# Patient Record
Sex: Male | Born: 1960 | Race: White | Hispanic: No | Marital: Married | Smoking: Never smoker
Health system: Southern US, Community
[De-identification: ages and names within clinical notes are randomized; demographics above are authoritative.]

## PROBLEM LIST (undated history)

## (undated) DIAGNOSIS — T1491XA Suicide attempt, initial encounter: Secondary | ICD-10-CM

## (undated) DIAGNOSIS — I10 Essential (primary) hypertension: Secondary | ICD-10-CM

## (undated) DIAGNOSIS — E7800 Pure hypercholesterolemia, unspecified: Secondary | ICD-10-CM

## (undated) DIAGNOSIS — F329 Major depressive disorder, single episode, unspecified: Secondary | ICD-10-CM

## (undated) DIAGNOSIS — G8929 Other chronic pain: Secondary | ICD-10-CM

## (undated) DIAGNOSIS — F32A Depression, unspecified: Secondary | ICD-10-CM

## (undated) HISTORY — PX: APPENDECTOMY: SHX54

## (undated) HISTORY — PX: HIP SURGERY: SHX245

---

## 2008-10-09 ENCOUNTER — Encounter: Payer: Self-pay | Admitting: Physician Assistant

## 2008-10-26 ENCOUNTER — Encounter: Payer: Self-pay | Admitting: Physician Assistant

## 2008-11-25 ENCOUNTER — Encounter: Payer: Self-pay | Admitting: Physician Assistant

## 2009-09-04 ENCOUNTER — Emergency Department (HOSPITAL_COMMUNITY): Admission: EM | Admit: 2009-09-04 | Discharge: 2009-09-04 | Payer: Self-pay | Admitting: Emergency Medicine

## 2011-02-24 ENCOUNTER — Other Ambulatory Visit (HOSPITAL_COMMUNITY): Payer: Self-pay | Admitting: Family Medicine

## 2011-02-24 ENCOUNTER — Ambulatory Visit (HOSPITAL_COMMUNITY)
Admission: RE | Admit: 2011-02-24 | Discharge: 2011-02-24 | Disposition: A | Discharge status: Home / Self Care | Payer: Medicaid Other | Source: Ambulatory Visit | Attending: Family Medicine | Admitting: Family Medicine

## 2011-02-24 DIAGNOSIS — Z96649 Presence of unspecified artificial hip joint: Secondary | ICD-10-CM

## 2011-02-24 DIAGNOSIS — M25559 Pain in unspecified hip: Secondary | ICD-10-CM | POA: Insufficient documentation

## 2011-04-07 ENCOUNTER — Inpatient Hospital Stay (HOSPITAL_COMMUNITY)
Admission: AD | Admit: 2011-04-07 | Discharge: 2011-04-10 | DRG: 885 | Disposition: A | Discharge status: Home / Self Care | Payer: Medicaid Other | Source: Ambulatory Visit | Attending: Psychiatry | Admitting: Psychiatry

## 2011-04-07 ENCOUNTER — Encounter: Payer: Self-pay | Admitting: *Deleted

## 2011-04-07 ENCOUNTER — Emergency Department (HOSPITAL_COMMUNITY)
Admission: EM | Admit: 2011-04-07 | Discharge: 2011-04-07 | Disposition: A | Discharge status: Other Acute Inpatient Hospital | Payer: Medicaid Other | Attending: Emergency Medicine | Admitting: Emergency Medicine

## 2011-04-07 DIAGNOSIS — F3289 Other specified depressive episodes: Secondary | ICD-10-CM | POA: Insufficient documentation

## 2011-04-07 DIAGNOSIS — I10 Essential (primary) hypertension: Secondary | ICD-10-CM

## 2011-04-07 DIAGNOSIS — R45851 Suicidal ideations: Secondary | ICD-10-CM

## 2011-04-07 DIAGNOSIS — F32A Depression, unspecified: Secondary | ICD-10-CM

## 2011-04-07 DIAGNOSIS — M25559 Pain in unspecified hip: Secondary | ICD-10-CM

## 2011-04-07 DIAGNOSIS — Z560 Unemployment, unspecified: Secondary | ICD-10-CM

## 2011-04-07 DIAGNOSIS — F339 Major depressive disorder, recurrent, unspecified: Principal | ICD-10-CM

## 2011-04-07 DIAGNOSIS — M549 Dorsalgia, unspecified: Secondary | ICD-10-CM

## 2011-04-07 DIAGNOSIS — F411 Generalized anxiety disorder: Secondary | ICD-10-CM

## 2011-04-07 DIAGNOSIS — E7800 Pure hypercholesterolemia, unspecified: Secondary | ICD-10-CM | POA: Insufficient documentation

## 2011-04-07 DIAGNOSIS — E785 Hyperlipidemia, unspecified: Secondary | ICD-10-CM

## 2011-04-07 DIAGNOSIS — G8929 Other chronic pain: Secondary | ICD-10-CM

## 2011-04-07 DIAGNOSIS — F329 Major depressive disorder, single episode, unspecified: Secondary | ICD-10-CM | POA: Insufficient documentation

## 2011-04-07 DIAGNOSIS — Z79899 Other long term (current) drug therapy: Secondary | ICD-10-CM

## 2011-04-07 HISTORY — DX: Essential (primary) hypertension: I10

## 2011-04-07 HISTORY — DX: Major depressive disorder, single episode, unspecified: F32.9

## 2011-04-07 HISTORY — DX: Pure hypercholesterolemia, unspecified: E78.00

## 2011-04-07 HISTORY — DX: Depression, unspecified: F32.A

## 2011-04-07 HISTORY — DX: Other chronic pain: G89.29

## 2011-04-07 HISTORY — DX: Suicide attempt, initial encounter: T14.91XA

## 2011-04-07 LAB — COMPREHENSIVE METABOLIC PANEL
AST: 18 U/L (ref 0–37)
Albumin: 3.9 g/dL (ref 3.5–5.2)
Calcium: 9 mg/dL (ref 8.4–10.5)
Creatinine, Ser: 1.07 mg/dL (ref 0.50–1.35)
GFR calc non Af Amer: >60 mL/min (ref >60)
Total Protein: 6.4 g/dL (ref 6.0–8.3)

## 2011-04-07 LAB — CBC
MCH: 29.9 pg (ref 26.0–34.0)
MCHC: 34.6 g/dL (ref 30.0–36.0)
MCV: 86.6 fL (ref 78.0–100.0)
Platelets: 177 10*3/uL (ref 150–400)
RDW: 12.2 % (ref 11.5–15.5)

## 2011-04-07 LAB — RAPID URINE DRUG SCREEN, HOSP PERFORMED
Barbiturates: NOT DETECTED
Benzodiazepines: NOT DETECTED
Cocaine: NOT DETECTED
Opiates: NOT DETECTED
Tetrahydrocannabinol: NOT DETECTED

## 2011-04-07 NOTE — ED Notes (Signed)
Pt has been evaluated by Tommy, [facility]. Pt remains calm and cooperative. Sitting on bedside. NAD. Spouse states she will take pts belongings home.

## 2011-04-07 NOTE — ED Provider Notes (Signed)
History     CSN: [id number] Arrival date & time: 04/07/2011 12:38 PM  Chief Complaint  Patient presents with  . Medical Clearance   HPI Pt was seen at 1330.  Per pt, c/o gradual onset and worsening of persistent depression and SI x2 weeks.  Pt states he will "cut myself up."  States his last SA was approx 2-3 mos ago by "cutting off a bunch of moles."  States he "thought I would bleed to death if I cut a bunch off."  Pt states he told his therapist at that time "what she wanted to hear so I wouldn't get admitted to the psych hospital."  Pt states he was eval by his outpt MH provider this morning, who sent him to the ED for eval and admit.  Denies HI.   Past Medical History  Diagnosis Date  . Suicide attempt   . Hypertension   . High cholesterol   . Depression   . Chronic pain     Past Surgical History  Procedure Date  . Hip surgery   . Appendectomy     History  Substance Use Topics  . Smoking status: Never Smoker   . Smokeless tobacco: Not on file  . Alcohol Use: No    Review of Systems ROS: Statement: All systems negative except as marked or noted in the HPI; Constitutional: Negative for fever and chills. ; ; Eyes: Negative for eye pain, redness and discharge. ; ; ENMT: Negative for ear pain, hoarseness, nasal congestion, sinus pressure and sore throat. ; ; Cardiovascular: Negative for chest pain, palpitations, diaphoresis, dyspnea and peripheral edema. ; ; Respiratory: Negative for cough, wheezing and stridor. ; ; Gastrointestinal: Negative for nausea, vomiting, diarrhea and abdominal pain, blood in stool, hematemesis, jaundice and rectal bleeding. . ; ; Genitourinary: Negative for dysuria, flank pain and hematuria. ; ; Musculoskeletal: Negative for back pain and neck pain. Negative for swelling and trauma.; ; Skin: Negative for pruritus, rash, abrasions, blisters, bruising and skin lesion.; ; Neuro: Negative for headache, lightheadedness and neck stiffness. Negative for weakness,  altered level of consciousness , altered mental status, extremity weakness, paresthesias, involuntary movement, seizure and syncope.  +SI, depression.    Physical Exam  BP 142/109  Pulse 58  Temp(Src) 98.5 F (36.9 C) (Oral)  Resp 16  Ht 5\' 9"  (1.753 m)  Wt 210 lb (95.255 kg)  BMI 31.01 kg/m2  SpO2 99%  Physical Exam 1335: Physical examination:  Nursing notes reviewed; Vital signs and O2 SAT reviewed;  Constitutional: Well developed, Well nourished, Well hydrated, In no acute distress, eating a meal during exam; Head:  Normocephalic, atraumatic; Eyes: EOMI, PERRL, No scleral icterus; ENMT: Mouth and pharynx normal, Mucous membranes moist; Neck: Supple, Full range of motion, No lymphadenopathy; Cardiovascular: Regular rate and rhythm, No murmur, rub, or gallop; Respiratory: Breath sounds clear & equal bilaterally, No rales, rhonchi, wheezes, or rub, Normal respiratory effort/excursion; Chest: Nontender, Movement normal;  Extremities: Pulses normal, No tenderness, No edema, No calf edema or asymmetry.; Neuro: AA&Ox3, Major CN grossly intact.  No gross focal motor or sensory deficits in extremities.; Skin: Color normal, Warm, Dry.  Affect:  Flat, +SI.    ED Course  Procedures  MDM MDM Reviewed: nursing note and vitals Interpretation: labs   Results for orders placed during the hospital encounter of 04/07/11  CBC      Component Value Range   WBC 5.8  4.0 - 10.5 (K/uL)   RBC 5.68  4.22 - 5.81 (MIL/uL)  Hemoglobin 17.0  13.0 - 17.0 (g/dL)   HCT 16.1  09.6 - 04.5 (%)   MCV 86.6  78.0 - 100.0 (fL)   MCH 29.9  26.0 - 34.0 (pg)   MCHC 34.6  30.0 - 36.0 (g/dL)   RDW 40.9  81.1 - 91.4 (%)   Platelets 177  150 - 400 (K/uL)  COMPREHENSIVE METABOLIC PANEL      Component Value Range   Sodium 138  135 - 145 (mEq/L)   Potassium 4.0  3.5 - 5.1 (mEq/L)   Chloride 102  96 - 112 (mEq/L)   CO2 30  19 - 32 (mEq/L)   Glucose, Bld 129 (*) 70 - 99 (mg/dL)   BUN 13  6 - 23 (mg/dL)   Creatinine,  Ser 7.82  0.50 - 1.35 (mg/dL)   Calcium 9.0  8.4 - 95.6 (mg/dL)   Total Protein 6.4  6.0 - 8.3 (g/dL)   Albumin 3.9  3.5 - 5.2 (g/dL)   AST 18  0 - 37 (U/L)   ALT 26  0 - 53 (U/L)   Alkaline Phosphatase 90  39 - 117 (U/L)   Total Bilirubin 0.4  0.3 - 1.2 (mg/dL)   GFR calc non Af Amer >60  >60 (mL/min)   GFR calc Af Amer >60  >60 (mL/min)  ETHANOL      Component Value Range   Alcohol, Ethyl (B) <11  0 - 11 (mg/dL)  URINE RAPID DRUG SCREEN (HOSP PERFORMED)      Component Value Range   Opiates NONE DETECTED  NONE DETECTED    Cocaine NONE DETECTED  NONE DETECTED    Benzodiazepines NONE DETECTED  NONE DETECTED    Amphetamines NONE DETECTED  NONE DETECTED    Tetrahydrocannabinol NONE DETECTED  NONE DETECTED    Barbiturates NONE DETECTED  NONE DETECTED      3:32 PM:  ACT team to eval.  10:07 PM:  Pt accepted to [facility].  Will transfer stable.    Raygan Skarda Allison Quarry, DO 04/07/11 2219

## 2011-04-07 NOTE — ED Notes (Signed)
Sitter at bedside. NAD. PT is calm and cooperative at this time. Family member at bedside.

## 2011-04-07 NOTE — ED Notes (Signed)
C/o SI x 2 wks.  States would "cut myself up."  Last attempted suicide x 3 months ago.  Denies HI.  Denies hallucinations/delusions/hearing voices.  Pt states "I know I need help and I'm to the point where I can't trust myself anymore."

## 2011-04-08 DIAGNOSIS — F339 Major depressive disorder, recurrent, unspecified: Secondary | ICD-10-CM

## 2011-04-08 DIAGNOSIS — F411 Generalized anxiety disorder: Secondary | ICD-10-CM

## 2011-04-09 LAB — FREE PSA
PSA, Free Pct: 40 % (ref >25)
PSA, Free: 0.16 ng/mL

## 2011-04-09 LAB — PSA: PSA: 0.4 ng/mL (ref <=4.00)

## 2011-04-09 NOTE — Assessment & Plan Note (Signed)
Tom Mccarthy, Tom Mccarthy             ACCOUNT NO.:  [id number]  MEDICAL RECORD NO.:  [id number]  LOCATION:  0502                          FACILITY:  [facility]  PHYSICIAN:  Franchot Gallo, MD     DATE OF BIRTH:  1961-06-15  DATE OF ADMISSION:  04/07/2011 DATE OF DISCHARGE:                      PSYCHIATRIC ADMISSION ASSESSMENT   IDENTIFICATION:  This is a 50 year old male voluntarily admitted on 04/07/2011.  HISTORY OF PRESENT ILLNESS:  The patient is here because he has been experiencing "a bad time" having increasing depressive symptoms, having increasing pain.  He reported to his therapist that he is going to cut on himself.  He states his therapist thought he was going to be progressing to having suicidal thoughts.  He has lost about 5 pounds recently, having some financial constraints.  He denies any psychotic symptoms.  PAST PSYCHIATRIC HISTORY:  First admission to the [facility].  He was receiving therapy through [facility] at [facility] and has a therapist named Fyffe.  SOCIAL HISTORY:  The patient is married.  He has 3 children and 5 grandchildren.  He is attempting to get disability.  [place]  He is unemployed.  He used to be an Personnel officer.  FAMILY HISTORY:  None.  ALCOHOL/DRUG HISTORY:  The patient denies any alcohol or substance use.  PRIMARY CARE PROVIDER:  Dr. Cecelia Byars in [place].  MEDICAL PROBLEMS:  He has a history of chronic pain.  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  MEDICATIONS: 1. Klonopin 0.5 mg b.i.d. 2. Paxil 20 mg taking 1-1/2 tablets daily. 3. BuSpar 10 mg t.i.d. 4. Gabapentin 300 mg, 1 in the morning, 2 at bedtime. 5. Omeprazole 20 mg daily. 6. Pravastatin 40 mg 1 daily. 7. Trazodone 100 mg taking 1-2 nightly. 8. Amlodipine 5/40 mg, 1 daily. 9. Oxycodone last prescribed on 04/03/2011 with oxycodone 7.5/325 mg,     1 t.i.d., received 90 from Dr.  Cecelia Byars.  DRUG ALLERGIES:  He states when they dilate his eyes, he has a reaction.  PHYSICAL EXAMINATION:  GENERAL:  This is a middle-aged male.  He is using a cane, currently in scrubs.  His urine drug screen is negative for opiates.  MENTAL STATUS EXAM:  The patient is fully alert and currently dressed in hospital scrubs.  Fair eye contact.  He was seen in the interdisciplinary treatment team.  His speech is clear.  He provides a good story to his symptoms and circumstances.  His mood is worthless, depressed.  He reports having no energy and denies any suicidal or homicidal thoughts. He does not appear to be reacting to any sort of internal stimuli.  Thought processes are coherent.  Cognitive function is intact.  Memory is intact.  DIAGNOSES:  Axis I:  Major depressive disorder, severe. Axis II:  Deferred. Axis III:  History of hip pain, hyperlipidemia, chronic pain. Axis IV:  Psychosocial problems, financial issues and medical problems. Axis V:  Current GAF is 35.  PLAN:  Our plan is to clarify the patient's medications through his pharmacy.  We will discontinue his Paxil and place the patient on Cymbalta which may help with his depression and  pain issues.  We will contact his family for support and concerns.  He will have tramadol for pain as the patient's urine drug screen at this time is negative for opiates.  The patient would continue to benefit from his therapist.  His tentative length of stay at this time is 3-5 days.     Landry Corporal, N.P.   ______________________________ Franchot Gallo, MD    JO/MEDQ  D:  04/08/2011  T:  04/08/2011  Job:  [id number]  Electronically Signed by Limmie PatriciaP. on 04/09/2011 09:27:48 AM Electronically Signed by Franchot Gallo MD on 04/09/2011 04:27:55 PM

## 2011-04-12 NOTE — Discharge Summary (Signed)
  Tom Mccarthy, Tom Mccarthy             ACCOUNT NO.:  [id number]  MEDICAL RECORD NO.:  [id number]  LOCATION:  0502                          FACILITY:  [facility]  PHYSICIAN:  Franchot Gallo, MD     DATE OF BIRTH:  August 04, 1960  DATE OF ADMISSION:  03/30/2011 DATE OF DISCHARGE:  04/10/2011                              DISCHARGE SUMMARY   REASON FOR ADMISSION:  This is a 50 year old male that was experiencing "a bad time," having increased depressive symptoms with increasing pain. He report to his therapist that he was going to cut himself, and she recommended that the patient be admitted for possible inpatient.  He reports some financial constraints.  He denied any psychotic symptoms.  FINAL IMPRESSION:  AXIS I:  Major depressive disorder recurrent, generalized anxiety disorder AXIS II: Deferred AXIS III:  Hypertension, history of hip pain, hyperlipidemia, chronic back pain AXIS IV: Financial constraints, chronic medical issues AXIS V: GAF at discharge is 70  LABORATORY DATA:  Urine drug screen was negative for opiates.  SIGNIFICANT FINDINGS:  The patient was admitted to adult milieu for safety and stabilization.  We discontinued his Paxil, placed the patient on Cymbalta to help with his depression and pain issues.  We contacted his family for support concerns and had tramadol for pain, as the patient's urine drug screen was negative for opiates at that time.  He was participating in groups.  He report that she had "said the wrong things to his therapist" and was sent here for evaluation.  He was beginning to feel better, reporting good sleep, having sinus headache, good appetite, having mild depressive symptoms rating it a 3 on a scale 1-10.  Denied any suicidal or homicidal thoughts or psychotic symptoms. His pain was a 5 on a scale of 1-10, re-grading his hopelessness a 2 on a scale of 1-10.  We increased his tramadol to 100 mg every 6 hours for pain.  On day of discharge, the patient  was seen in the interdisciplinary treatment team.  He was appreciative of the help that he had received.  He was fully alert and reporting good sleep, good appetite, stating his depression resolved rating it a 1 on a scale of 1- 10.  Denied any suicidal or homicidal thoughts or psychotic symptoms. His anxiety had resolved rating it a 1 on a scale of 1-10, and denied any medication side effects.  DISCHARGE MEDICATIONS: 1. Klonopin 0.5 mg 1 b.i.d. 2. Cymbalta 60 mg daily 3. Tramadol 100 mg q.i.d. 4. Trazodone 50 mg for sleep 5. Gabapentin 600 mg 1 at 8:00, 2 at 10:00 p.m. 6. Amlodipine/benazepril 5/40 daily 7. Omeprazole 20 mg daily 8. Pravastatin 40 mg daily 9. Stop taking Paxil  FOLLOWUP:  Follow-up appointment was with [facility] at phone number [phone].  Psychiatrist and [facility] therapist, phone number [phone] on October 4 at 3:00 p.m.     Landry Corporal, N.P.   ______________________________ Franchot Gallo, MD    JO/MEDQ  D:  04/10/2011  T:  04/10/2011  Job:  [id number]  Electronically Signed by Limmie PatriciaP. on 04/11/2011 09:23:25 AM Electronically Signed by Franchot Gallo MD on 04/12/2011 06:23:52 AM

## 2014-02-23 ENCOUNTER — Encounter (INDEPENDENT_AMBULATORY_CARE_PROVIDER_SITE_OTHER): Payer: Self-pay | Admitting: *Deleted

## 2015-01-19 ENCOUNTER — Other Ambulatory Visit (HOSPITAL_COMMUNITY): Payer: Self-pay | Admitting: Family Medicine

## 2015-01-19 ENCOUNTER — Ambulatory Visit (HOSPITAL_COMMUNITY)
Admission: RE | Admit: 2015-01-19 | Discharge: 2015-01-19 | Disposition: A | Discharge status: Home / Self Care | Payer: Medicare Other | Source: Ambulatory Visit | Attending: Family Medicine | Admitting: Family Medicine

## 2015-01-19 DIAGNOSIS — M25551 Pain in right hip: Secondary | ICD-10-CM | POA: Diagnosis present

## 2015-01-19 DIAGNOSIS — N420 Calculus of prostate: Secondary | ICD-10-CM | POA: Diagnosis not present

## 2015-01-19 DIAGNOSIS — Z96641 Presence of right artificial hip joint: Secondary | ICD-10-CM | POA: Diagnosis not present

## 2015-11-14 ENCOUNTER — Other Ambulatory Visit (HOSPITAL_COMMUNITY): Payer: Self-pay | Admitting: Family Medicine

## 2015-11-14 ENCOUNTER — Ambulatory Visit (HOSPITAL_COMMUNITY)
Admission: RE | Admit: 2015-11-14 | Discharge: 2015-11-14 | Disposition: A | Discharge status: Home / Self Care | Payer: Medicare Other | Source: Ambulatory Visit | Attending: Family Medicine | Admitting: Family Medicine

## 2015-11-14 DIAGNOSIS — W19XXXA Unspecified fall, initial encounter: Secondary | ICD-10-CM

## 2015-11-14 DIAGNOSIS — M25512 Pain in left shoulder: Secondary | ICD-10-CM | POA: Insufficient documentation

## 2017-11-23 ENCOUNTER — Ambulatory Visit (HOSPITAL_COMMUNITY)
Admission: RE | Admit: 2017-11-23 | Discharge: 2017-11-23 | Disposition: A | Discharge status: Home / Self Care | Payer: Medicare Other | Source: Ambulatory Visit | Attending: Family Medicine | Admitting: Family Medicine

## 2017-11-23 ENCOUNTER — Other Ambulatory Visit (HOSPITAL_COMMUNITY): Payer: Self-pay | Admitting: Family Medicine

## 2017-11-23 DIAGNOSIS — R52 Pain, unspecified: Secondary | ICD-10-CM | POA: Insufficient documentation

## 2017-11-23 DIAGNOSIS — Z96641 Presence of right artificial hip joint: Secondary | ICD-10-CM | POA: Insufficient documentation

## 2019-01-26 ENCOUNTER — Other Ambulatory Visit: Payer: Self-pay

## 2019-01-26 ENCOUNTER — Emergency Department (HOSPITAL_COMMUNITY)
Admission: EM | Admit: 2019-01-26 | Discharge: 2019-01-26 | Disposition: A | Discharge status: Home / Self Care | Payer: Medicare Other | Attending: Emergency Medicine | Admitting: Emergency Medicine

## 2019-01-26 ENCOUNTER — Encounter (HOSPITAL_COMMUNITY): Payer: Self-pay | Admitting: Emergency Medicine

## 2019-01-26 ENCOUNTER — Emergency Department (HOSPITAL_COMMUNITY): Payer: Medicare Other

## 2019-01-26 DIAGNOSIS — R5383 Other fatigue: Secondary | ICD-10-CM | POA: Insufficient documentation

## 2019-01-26 DIAGNOSIS — R319 Hematuria, unspecified: Secondary | ICD-10-CM | POA: Diagnosis not present

## 2019-01-26 DIAGNOSIS — R0600 Dyspnea, unspecified: Secondary | ICD-10-CM | POA: Insufficient documentation

## 2019-01-26 DIAGNOSIS — Z79899 Other long term (current) drug therapy: Secondary | ICD-10-CM | POA: Diagnosis not present

## 2019-01-26 DIAGNOSIS — I10 Essential (primary) hypertension: Secondary | ICD-10-CM | POA: Insufficient documentation

## 2019-01-26 DIAGNOSIS — I951 Orthostatic hypotension: Secondary | ICD-10-CM | POA: Diagnosis not present

## 2019-01-26 DIAGNOSIS — R531 Weakness: Secondary | ICD-10-CM | POA: Diagnosis present

## 2019-01-26 DIAGNOSIS — M7918 Myalgia, other site: Secondary | ICD-10-CM | POA: Diagnosis not present

## 2019-01-26 DIAGNOSIS — R42 Dizziness and giddiness: Secondary | ICD-10-CM | POA: Insufficient documentation

## 2019-01-26 LAB — BASIC METABOLIC PANEL
Anion gap: 13 (ref 5–15)
BUN: 21 mg/dL — ABNORMAL HIGH (ref 6–20)
CO2: 29 mmol/L (ref 22–32)
Calcium: 9.8 mg/dL (ref 8.9–10.3)
Chloride: 96 mmol/L — ABNORMAL LOW (ref 98–111)
Creatinine, Ser: 1.35 mg/dL — ABNORMAL HIGH (ref 0.61–1.24)
GFR calc Af Amer: >60 mL/min (ref >60)
GFR calc non Af Amer: 57 mL/min — ABNORMAL LOW (ref >60)
Glucose, Bld: 123 mg/dL — ABNORMAL HIGH (ref 70–99)
Potassium: 3.6 mmol/L (ref 3.5–5.1)
Sodium: 138 mmol/L (ref 135–145)

## 2019-01-26 LAB — URINALYSIS, ROUTINE W REFLEX MICROSCOPIC
Bacteria, UA: NONE SEEN
Bilirubin Urine: NEGATIVE
Glucose, UA: NEGATIVE mg/dL
Ketones, ur: NEGATIVE mg/dL
Leukocytes,Ua: NEGATIVE
Nitrite: NEGATIVE
Protein, ur: NEGATIVE mg/dL
RBC / HPF: >50 RBC/hpf — ABNORMAL HIGH (ref 0–5)
Specific Gravity, Urine: 1.017 (ref 1.005–1.030)
pH: 5 (ref 5.0–8.0)

## 2019-01-26 LAB — HEPATIC FUNCTION PANEL
ALT: 31 U/L (ref 0–44)
AST: 19 U/L (ref 15–41)
Albumin: 4.4 g/dL (ref 3.5–5.0)
Alkaline Phosphatase: 92 U/L (ref 38–126)
Bilirubin, Direct: 0.2 mg/dL (ref 0.0–0.2)
Indirect Bilirubin: 0.8 mg/dL (ref 0.3–0.9)
Total Bilirubin: 1 mg/dL (ref 0.3–1.2)
Total Protein: 6.8 g/dL (ref 6.5–8.1)

## 2019-01-26 LAB — CBG MONITORING, ED: Glucose-Capillary: 119 mg/dL — ABNORMAL HIGH (ref 70–99)

## 2019-01-26 LAB — CBC
HCT: 54.1 % — ABNORMAL HIGH (ref 39.0–52.0)
Hemoglobin: 18.6 g/dL — ABNORMAL HIGH (ref 13.0–17.0)
MCH: 29.1 pg (ref 26.0–34.0)
MCHC: 33.8 g/dL (ref 30.0–36.0)
MCV: 86.1 fL (ref 80.0–100.0)
Platelets: 322 10*3/uL (ref 150–400)
RBC: 6.4 MIL/uL — ABNORMAL HIGH (ref 4.22–5.81)
RDW: 11.8 % (ref 11.5–15.5)
WBC: 11.6 10*3/uL — ABNORMAL HIGH (ref 4.0–10.5)
nRBC: 0 % (ref 0.0–0.2)

## 2019-01-26 LAB — CK: Total CK: 51 U/L (ref 49–397)

## 2019-01-26 MED ORDER — SODIUM CHLORIDE 0.9 % IV BOLUS
1000.0000 mL | Freq: Once | INTRAVENOUS | Status: AC
  Administered 2019-01-26: 1000 mL via INTRAVENOUS

## 2019-01-26 MED ORDER — SODIUM CHLORIDE 0.9% FLUSH: 3.0000 mL | Freq: Once | INTRAVENOUS | Status: DC

## 2019-01-26 NOTE — Discharge Instructions (Addendum)
You were seen in the emergency department today for generalized weakness with additional symptoms.  Chest x-ray was normal. Your labs show that your hemoglobin, hematocrit, and white blood cell counts were mildly elevated, this should be rechecked by primary care Your kidney function was also a bit elevated which should be rechecked by primary care You did have blood in your urine, for this reason we have given you information for the urology office, please call to make an appointment for further evaluation of this.  You were found to have what is known is orthostatic hypotension in the emergency department.  You were given fluids with improvement of this.  It is important to stay well-hydrated.  Please also discuss this with your primary care as this could be due to medications you are taking, they may need to make some adjustments.  Try to transition positions slowly.  Please follow-up with primary care with urology within the next 1 to 3 days.  Return to the ER for new or worsening symptoms or any other concerns.

## 2019-01-26 NOTE — ED Triage Notes (Signed)
Patient c/o weakness, SOB, and feeling ill since Friday. No fever.

## 2019-01-26 NOTE — ED Provider Notes (Addendum)
[facility] EMERGENCY DEPARTMENT Provider Note   CSN: [id number] Arrival date & time: 01/26/19  1542     History   Chief Complaint Chief Complaint  Patient presents with  . Weakness    HPI Tom Mccarthy is a 58 y.o. male with a hx of HTN, hypercholesterolemia, & depression who presents to the ED w/ complaints of generalized weakness & fatigue x 6 days. Patient states that this past Saturday he was out in the hot sun doing a lot of work outside when he began to feel generally weak & exhausted. He states sxs have persisted since onset w/ associated body aches, dyspnea which he is unsure if is related to panic attacks, & has also had some lightheadedness w/ position changes but no syncope. No alleviating/aggravating factors. Trying to stay hydrated w/o much improvement. States he has had some abdominal bloating/soreness x 1 month- not necessarily pain. Also had some lower extremity edema- saw PCP who started him on Torsemide day prior to generalized weakness onset. LE edema resolved with this medicine.   Denies fever, chills, URI sxs, cough, hemoptysis, vomiting, diarrhea, dysuria, or chest pain. Denies known COVID 19 exposure. No recent travel.  Denies EtOH or illicit drug use.     HPI  Past Medical History:  Diagnosis Date  . Chronic pain   . Depression   . High cholesterol   . Hypertension   . Suicide attempt ([place])     There are no active problems to display for this patient.   Past Surgical History:  Procedure Laterality Date  . APPENDECTOMY    . HIP SURGERY          Home Medications    Prior to Admission medications   Medication Sig Start Date End Date Taking? Authorizing Provider  amLODipine-benazepril (LOTREL) 5-40 MG per capsule Take 1 capsule by mouth daily.      [provider]  busPIRone (BUSPAR) 10 MG tablet Take 10 mg by mouth 3 (three) times daily.      [provider]  clonazePAM (KLONOPIN) 1 MG tablet Take 1 mg by mouth 2 (two) times  daily as needed. anxiety     [provider]  gabapentin (NEURONTIN) 300 MG capsule Take 300 mg by mouth 2 (two) times daily. 1 in the morning and 2 at bedtime     [provider]  omeprazole (PRILOSEC) 20 MG capsule Take 20 mg by mouth daily.      [provider]  oxycodone (OXY-IR) 5 MG capsule Take 5 mg by mouth every 4 (four) hours as needed. For pain     [provider]  oxyCODONE-acetaminophen (PERCOCET) 7.5-325 MG per tablet Take 1 tablet by mouth every 4 (four) hours as needed. For pain     [provider]  PARoxetine (PAXIL) 20 MG tablet Take 30 mg by mouth every morning.     [provider]  pravastatin (PRAVACHOL) 40 MG tablet Take 40 mg by mouth at bedtime.      [provider]  traZODone (DESYREL) 100 MG tablet Take 200 mg by mouth at bedtime.      [provider]    Family History Family History  Problem Relation Age of Onset  . Heart disease Other     Social History Social History   Tobacco Use  . Smoking status: Never Smoker  . Smokeless tobacco: Never Used  Substance Use Topics  . Alcohol use: No  . Drug use: No  Allergies   Food, Shellfish allergy, and Ivp dye [iodinated diagnostic agents]   Review of Systems Review of Systems  Constitutional: Positive for fatigue. Negative for chills and fever.  Respiratory: Positive for shortness of breath. Negative for cough.   Cardiovascular: Positive for leg swelling (resolved @ present). Negative for chest pain.  Gastrointestinal: Negative for abdominal pain, blood in stool, constipation, diarrhea and vomiting.       + for abdominal bloating  Musculoskeletal: Positive for myalgias.  Neurological: Positive for weakness (generalized). Negative for numbness.  Psychiatric/Behavioral: Negative for hallucinations, self-injury and suicidal ideas. The patient is nervous/anxious.   All other systems reviewed and are negative.    Physical Exam  Updated Vital Signs BP (!) 135/101 (BP Location: Right Arm)   Pulse (!) 105   Temp 97.9 F (36.6 C) (Oral)   Resp 18   SpO2 100%   Physical Exam Vitals signs and nursing note reviewed.  Constitutional:      General: He is not in acute distress.    Appearance: He is well-developed. He is not toxic-appearing.  HENT:     Head: Normocephalic and atraumatic.  Eyes:     General:        Right eye: No discharge.        Left eye: No discharge.     Conjunctiva/sclera: Conjunctivae normal.  Neck:     Musculoskeletal: Neck supple. No neck rigidity.     Comments: ROM intact Cardiovascular:     Rate and Rhythm: Normal rate and regular rhythm.  Pulmonary:     Effort: Pulmonary effort is normal. No respiratory distress.     Breath sounds: Normal breath sounds. No wheezing, rhonchi or rales.  Abdominal:     General: There is no distension.     Palpations: Abdomen is soft.     Tenderness: There is abdominal tenderness (mild to lower). There is no right CVA tenderness, left CVA tenderness, guarding or rebound. Negative signs include Murphy's sign and McBurney's sign.  Musculoskeletal:     Right lower leg: No edema.     Left lower leg: No edema.     Comments: Intact active range of motion throughout without point/focal bony tenderness.  Compartments are soft.  Skin:    General: Skin is warm and dry.     Findings: No rash.  Neurological:     General: No focal deficit present.     Mental Status: He is alert.     Comments: Clear speech.   Psychiatric:        Behavior: Behavior normal.    ED Treatments / Results  Labs (all labs ordered are listed, but only abnormal results are displayed) Labs Reviewed  BASIC METABOLIC PANEL - Abnormal; Notable for the following components:      Result Value   Chloride 96 (*)    Glucose, Bld 123 (*)    BUN 21 (*)    Creatinine, Ser 1.35 (*)    GFR calc non Af Amer 57 (*)    All other components within normal limits  CBC - Abnormal; Notable for the  following components:   WBC 11.6 (*)    RBC 6.40 (*)    Hemoglobin 18.6 (*)    HCT 54.1 (*)    All other components within normal limits  URINALYSIS, ROUTINE W REFLEX MICROSCOPIC - Abnormal; Notable for the following components:   APPearance HAZY (*)    Hgb urine dipstick LARGE (*)    RBC / HPF >50 (*)  All other components within normal limits  CBG MONITORING, ED - Abnormal; Notable for the following components:   Glucose-Capillary 119 (*)    All other components within normal limits  CK  HEPATIC FUNCTION PANEL    EKG EKG Interpretation  Date/Time:  Wednesday January 26 2019 15:58:19 EDT Ventricular Rate:  101 PR Interval:  144 QRS Duration: 104 QT Interval:  340 QTC Calculation: 440 R Axis:   106 Text Interpretation:  Sinus tachycardia Rightward axis Borderline ECG Confirmed by Bethann BerkshireZammit, Jaikob Borgwardt [phone]([id number]) on 01/26/2019 8:29:21 PM   Radiology Dg Chest Portable 1 View  Result Date: 01/26/2019 CLINICAL DATA:  [id number] year old male with history of weakness and shortness of breath. EXAM: PORTABLE CHEST 1 VIEW COMPARISON:  No priors. FINDINGS: Lung volumes are normal. No consolidative airspace disease. No pleural effusions. No pneumothorax. No pulmonary nodule or mass noted. Pulmonary vasculature and the cardiomediastinal silhouette are within normal limits. IMPRESSION: No radiographic evidence of acute cardiopulmonary disease. Electronically Signed   By: Trudie Reedaniel  Entrikin M.D.   On: 01/26/2019 18:47    Procedures Procedures (including critical care time)  Medications Ordered in ED Medications  sodium chloride flush (NS) 0.9 % injection 3 mL (3 mLs Intravenous Not Given 01/26/19 1909)  sodium chloride 0.9 % bolus 1,000 mL (0 mLs Intravenous Stopped 01/26/19 2057)     Initial Impression / Assessment and Plan / ED Course  I have reviewed the triage vital signs and the nursing notes.  Pertinent labs & imaging results that were available during my care of the patient were reviewed by me and  considered in my medical decision making (see chart for details).   Patient presents to the ED w/ generalized weakness/fatigue with associated lightheadedness w/ positions changes, intermittent dyspnea/anxiety, & generalized body aches. Mentions some abdominal bloating for past 1 month. No acute change. No other associated GI sxs. Nontoxic appearing, no apparent distress, initial tachycardia normalized on my exam, BP mildly elevated- doubt HTN emergency. Afebrile. Not hypoxic. Overall patient has a fairly benign physical exam. Mild lower abdominal tenderness. No respiratory distress. No focal neuro deficits.   CBC: Mild nonspecific leukocytosis. Hgb/Hct elevated- possible degree of hemoconcentration.  BMP: Creatinine mildly elevated from prior, no significant electrolyte derangement- mild hypochloremia Hepatic function panel; WNL CK: WNL UA: Hematuria without UTI CXR: Negative for acute cardiopulmonary abnormality.  EKG: Sinus tachycardia Orthostatic vital signs are positive.  No URI sxs. CXR w/o pneumonia . Low risk wells doubt PE. Dyspnea may be anxiety related, unclear definitive etiology but no signs of respiratory distress here. Abdomen soft, mildly tender in lower abdomen, but no peritoneal signs or associated N/V/D/constiaption, complaints of bloating x 1 month- doubt acute surgical abdomen such as with appendicitis, cholecystitis, perf/obstruction, abscess etc.. No focal neuro deficits. No anemia or electrolyte derangement. Patient is not in rhabdo. Overall unclear definitive etiology to his sxs, possibly dehydration? Possibly related to new diuretic started day prior to onset?- will need PCP follow up in general. Will need urology follow up for hematuria, patient states hx of nephrolithiasis but has not been having sxs consistent w/ prior stones.  Discussed with supervising physician Dr. Estell HarpinZammit who has personally evaluated patient- recommends 1L fluids, repeat orthostatics- if improved  recommends discharge home with urology follow up for hematuria & general PCP follow up. I am in agreement.   Orthostatic vitals improved following fluids. Patient feeling better. Will discharge home at this time. I discussed results, treatment plan, need for follow-up, and return precautions with the patient. Provided opportunity  for questions, patient confirmed understanding and is in agreement with plan.    This is a shared visit with supervising physician Dr. Estell HarpinZammit who has independently evaluated patient & provided guidance in evaluation/management/disposition, in agreement with care   Orthostatic VS for the past 24 hrs:  BP- Lying Pulse- Lying BP- Sitting Pulse- Sitting BP- Standing at 0 minutes Pulse- Standing at 0 minutes  01/26/19 2038 133/89 62 122/88 79 121/89 80  01/26/19 1856 132/85 78 (!) 134/91 85 104/71 119     Final Clinical Impressions(s) / ED Diagnoses   Final diagnoses:  Generalized weakness  Orthostatic hypotension  Hematuria, unspecified type    ED Discharge Orders    None    Medical screening examination/treatment/procedure(s) were conducted as a shared visit with non-physician practitioner(s) and myself.  I personally evaluated the patient during the encounter.  EKG Interpretation  Date/Time:  Wednesday January 26 2019 15:58:19 EDT Ventricular Rate:  101 PR Interval:  144 QRS Duration: 104 QT Interval:  340 QTC Calculation: 440 R Axis:   106 Text Interpretation:  Sinus tachycardia Rightward axis Borderline ECG Confirmed by Bethann BerkshireZammit, Garrie Woodin [phone]([id number]) on 01/26/2019 8:29:21 PM  Pt with weakness.  pe lungs clear heart rrr   Cherly Andersonetrucelli, Samantha R, PA-C 01/26/19 2108    Bethann BerkshireZammit, Honestie Kulik, MD 01/30/19 [id number]    Bethann BerkshireZammit, Lilias Lorensen, MD 01/30/19 [id number]

## 2019-01-26 NOTE — ED Notes (Signed)
ED Provider at bedside. 

## 2020-01-18 IMAGING — CR PORTABLE CHEST - 1 VIEW
1 series · 4 of 4 positions shown · non-contrast
Comparison: No priors.

CLINICAL DATA: 58-year-old male with history of weakness and
shortness of breath.

EXAM:
PORTABLE CHEST 1 VIEW

[Series 1: portable · 0.17mm/px · 4 of 4 slices shown]
[im 1/4]
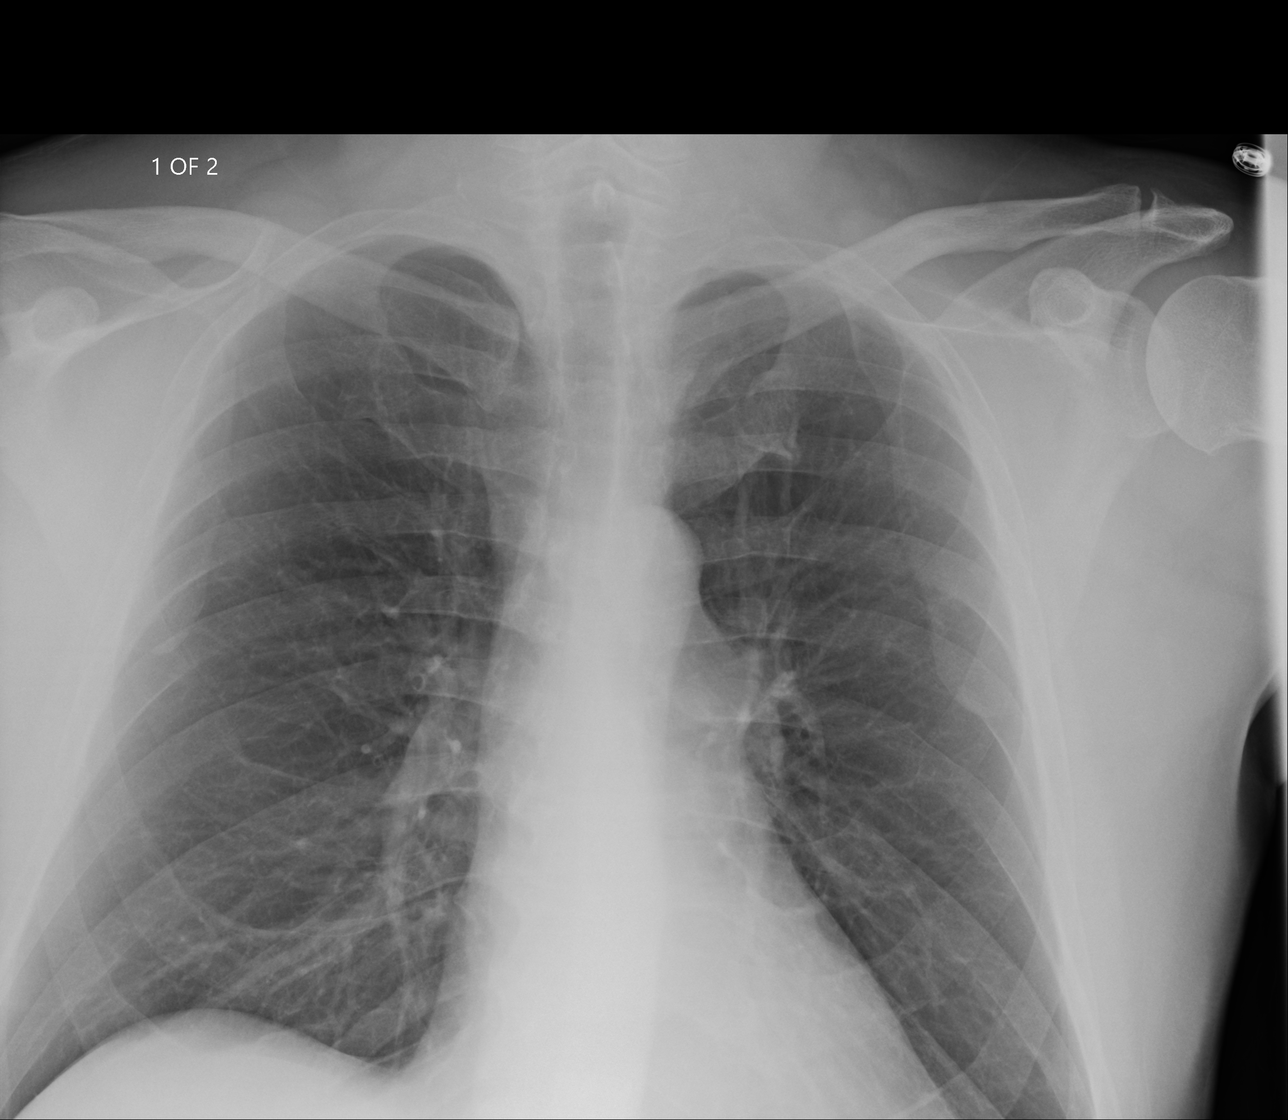
[im 2/4]
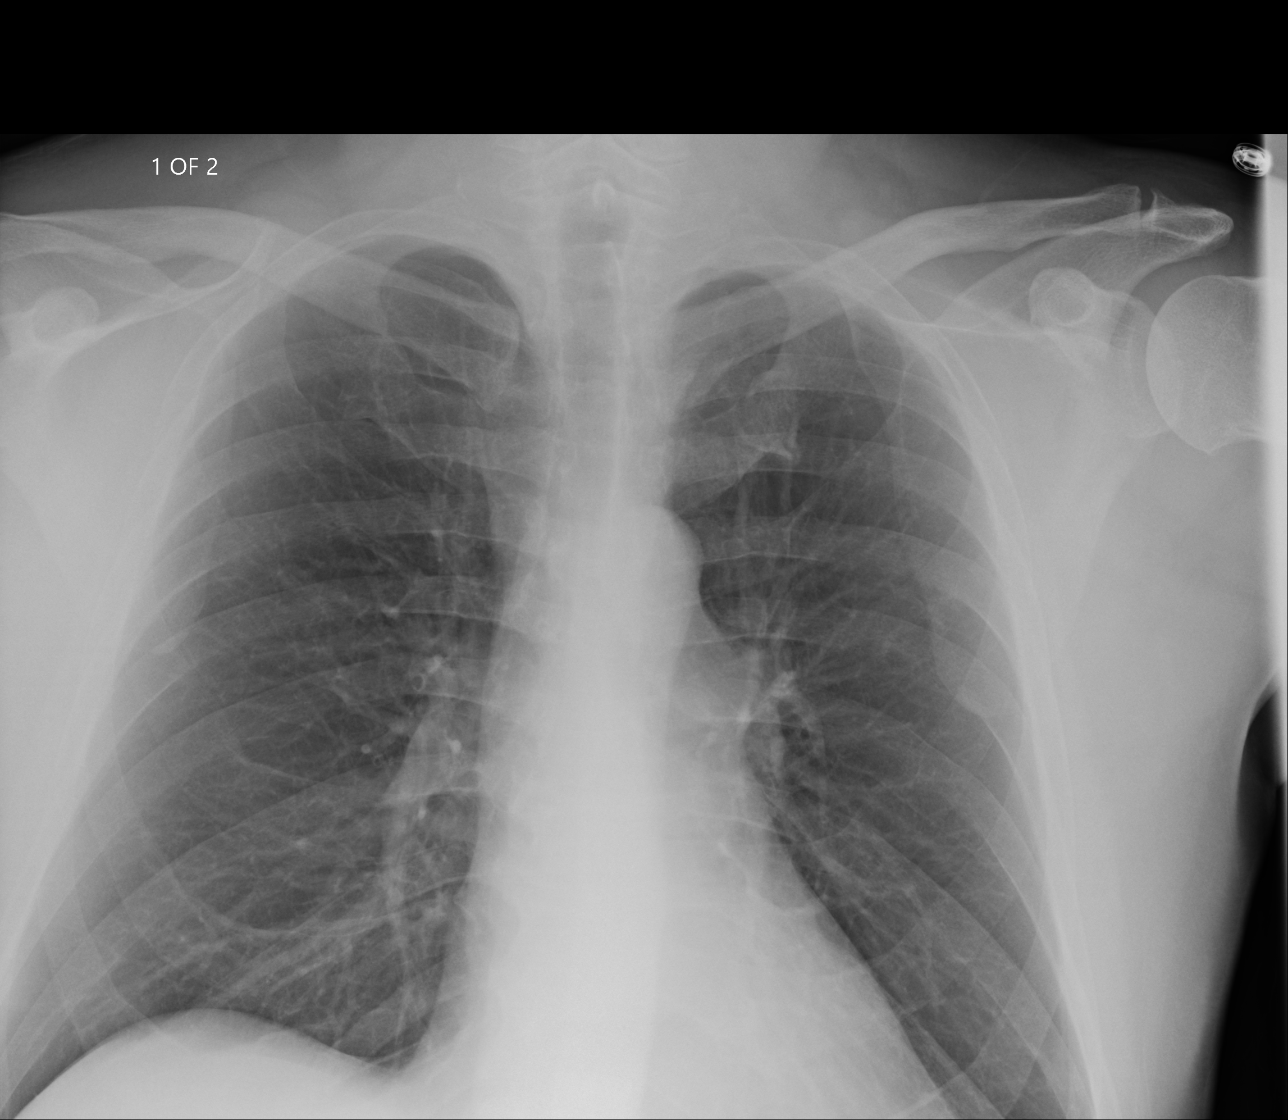
[im 3/4]
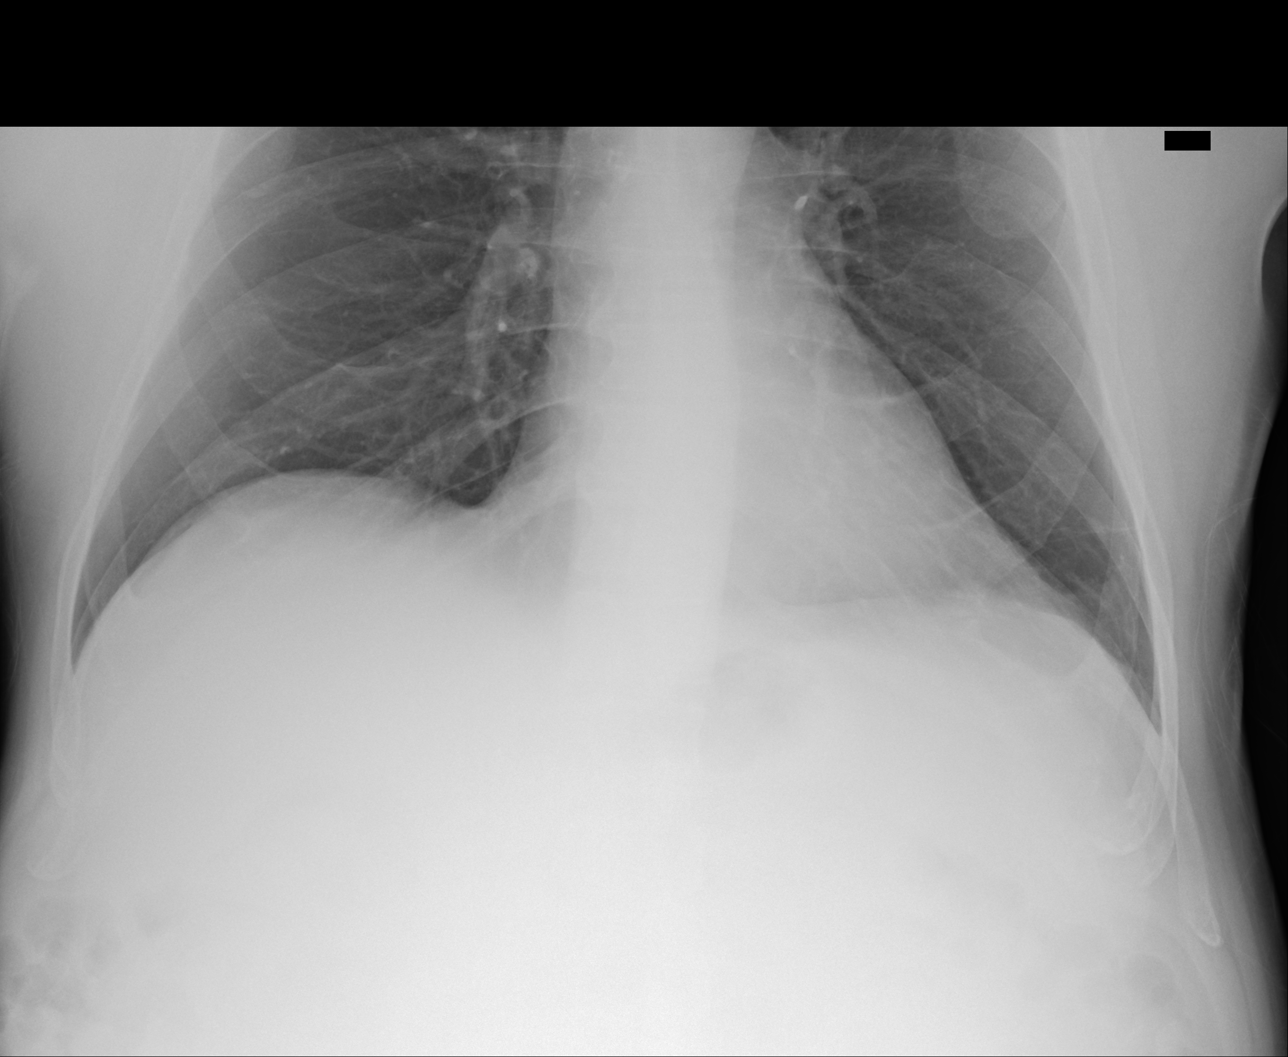
[im 4/4]
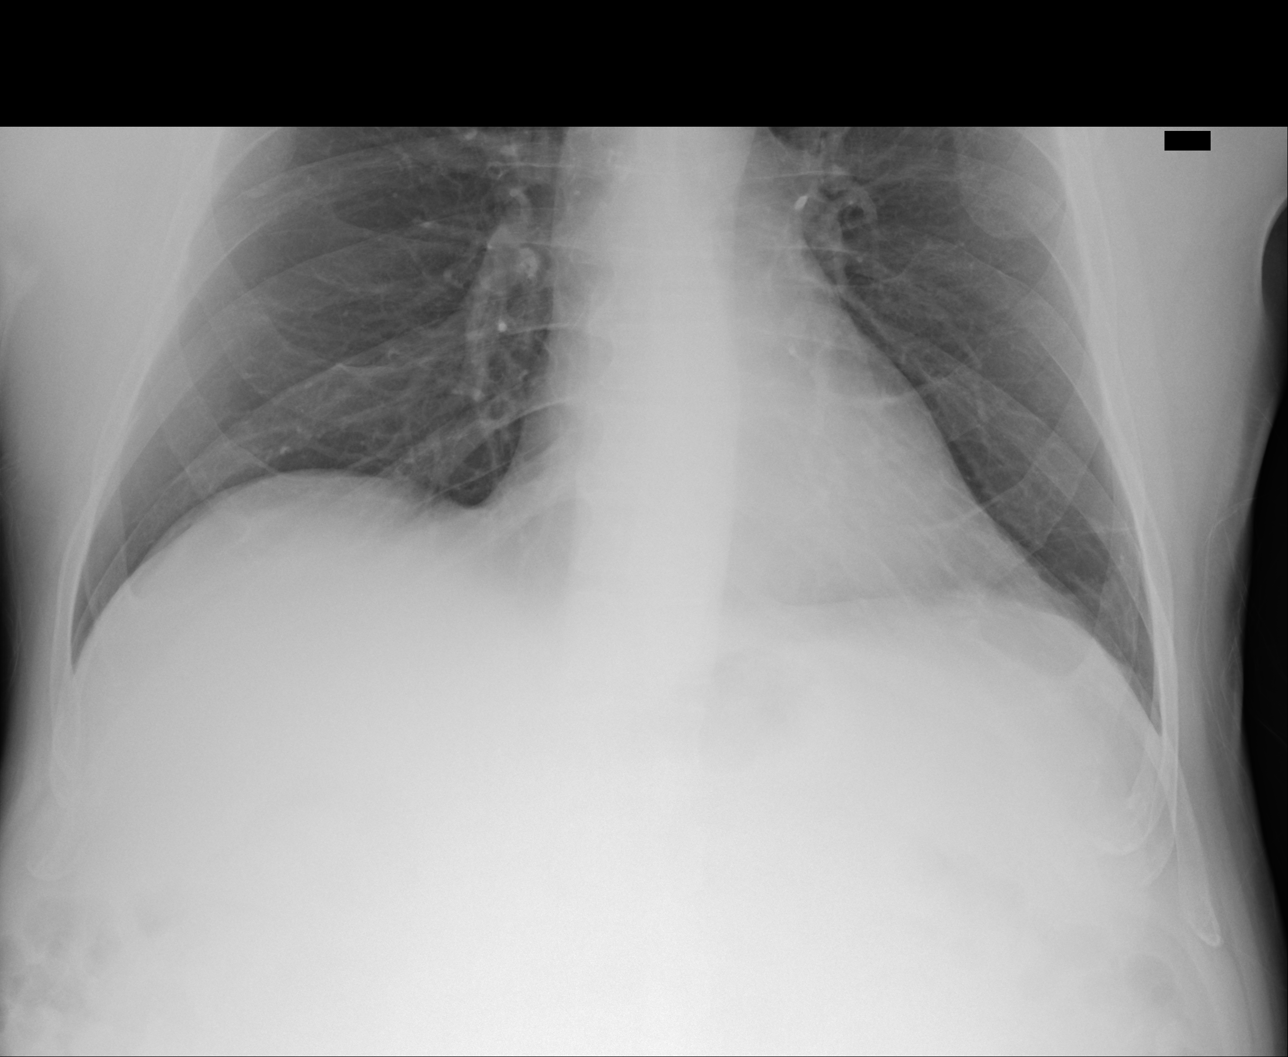

[4 of 4 positions shown; findings below may reference images not displayed]

FINDINGS: Lung volumes are normal. No consolidative airspace disease. No
pleural effusions. No pneumothorax. No pulmonary nodule or mass
noted. Pulmonary vasculature and the cardiomediastinal silhouette
are within normal limits.
IMPRESSION: No radiographic evidence of acute cardiopulmonary disease.
